# Patient Record
Sex: Female | Born: 1965 | Race: White | Hispanic: No | Smoking: Current every day smoker
Health system: Southern US, Community
[De-identification: ages and names within clinical notes are randomized; demographics above are authoritative.]

## PROBLEM LIST (undated history)

## (undated) DIAGNOSIS — F32A Depression, unspecified: Secondary | ICD-10-CM

## (undated) DIAGNOSIS — F329 Major depressive disorder, single episode, unspecified: Secondary | ICD-10-CM

## (undated) DIAGNOSIS — F419 Anxiety disorder, unspecified: Secondary | ICD-10-CM

## (undated) DIAGNOSIS — N200 Calculus of kidney: Secondary | ICD-10-CM

## (undated) HISTORY — DX: Major depressive disorder, single episode, unspecified: F32.9

## (undated) HISTORY — PX: PLACEMENT OF BREAST IMPLANTS: SHX6334

## (undated) HISTORY — DX: Anxiety disorder, unspecified: F41.9

## (undated) HISTORY — PX: NASAL SINUS SURGERY: SHX719

## (undated) HISTORY — DX: Depression, unspecified: F32.A

---

## 2017-06-08 ENCOUNTER — Emergency Department
Admission: EM | Admit: 2017-06-08 | Discharge: 2017-06-08 | Disposition: A | Discharge status: Home / Self Care | Payer: Managed Care, Other (non HMO) | Attending: Emergency Medicine | Admitting: Emergency Medicine

## 2017-06-08 ENCOUNTER — Emergency Department: Payer: Managed Care, Other (non HMO)

## 2017-06-08 ENCOUNTER — Encounter: Payer: Self-pay | Admitting: Emergency Medicine

## 2017-06-08 ENCOUNTER — Other Ambulatory Visit: Payer: Self-pay

## 2017-06-08 DIAGNOSIS — A09 Infectious gastroenteritis and colitis, unspecified: Secondary | ICD-10-CM

## 2017-06-08 DIAGNOSIS — R109 Unspecified abdominal pain: Secondary | ICD-10-CM | POA: Insufficient documentation

## 2017-06-08 DIAGNOSIS — F17210 Nicotine dependence, cigarettes, uncomplicated: Secondary | ICD-10-CM | POA: Insufficient documentation

## 2017-06-08 HISTORY — DX: Calculus of kidney: N20.0

## 2017-06-08 LAB — CBC WITH DIFFERENTIAL/PLATELET
Basophils Absolute: 0 10*3/uL (ref 0–0.1)
Basophils Relative: 0 %
Eosinophils Absolute: 0.1 10*3/uL (ref 0–0.7)
Eosinophils Relative: 1 %
HEMATOCRIT: 44.9 % (ref 35.0–47.0)
Hemoglobin: 15.1 g/dL (ref 12.0–16.0)
LYMPHS PCT: 27 %
Lymphs Abs: 2 10*3/uL (ref 1.0–3.6)
MCH: 31.5 pg (ref 26.0–34.0)
MCHC: 33.7 g/dL (ref 32.0–36.0)
MCV: 93.6 fL (ref 80.0–100.0)
MONO ABS: 0.5 10*3/uL (ref 0.2–0.9)
MONOS PCT: 7 %
NEUTROS ABS: 5 10*3/uL (ref 1.4–6.5)
Neutrophils Relative %: 65 %
Platelets: 283 10*3/uL (ref 150–440)
RBC: 4.8 MIL/uL (ref 3.80–5.20)
RDW: 12.9 % (ref 11.5–14.5)
WBC: 7.6 10*3/uL (ref 3.6–11.0)

## 2017-06-08 LAB — COMPREHENSIVE METABOLIC PANEL
ALT: 12 U/L — ABNORMAL LOW (ref 14–54)
ANION GAP: 9 (ref 5–15)
AST: 25 U/L (ref 15–41)
Albumin: 4.6 g/dL (ref 3.5–5.0)
Alkaline Phosphatase: 56 U/L (ref 38–126)
BILIRUBIN TOTAL: 0.6 mg/dL (ref 0.3–1.2)
BUN: 11 mg/dL (ref 6–20)
CO2: 24 mmol/L (ref 22–32)
Calcium: 9.3 mg/dL (ref 8.9–10.3)
Chloride: 104 mmol/L (ref 101–111)
Creatinine, Ser: 0.6 mg/dL (ref 0.44–1.00)
GFR calc Af Amer: >60 mL/min (ref >60)
Glucose, Bld: 112 mg/dL — ABNORMAL HIGH (ref 65–99)
POTASSIUM: 3.1 mmol/L — AB (ref 3.5–5.1)
Sodium: 137 mmol/L (ref 135–145)
TOTAL PROTEIN: 6.9 g/dL (ref 6.5–8.1)

## 2017-06-08 LAB — URINALYSIS, COMPLETE (UACMP) WITH MICROSCOPIC
Bilirubin Urine: NEGATIVE
GLUCOSE, UA: NEGATIVE mg/dL
Hgb urine dipstick: NEGATIVE
Ketones, ur: 5 mg/dL — AB
Leukocytes, UA: NEGATIVE
Nitrite: NEGATIVE
PROTEIN: NEGATIVE mg/dL
Specific Gravity, Urine: 1.012 (ref 1.005–1.030)
pH: 8 (ref 5.0–8.0)

## 2017-06-08 LAB — POCT PREGNANCY, URINE: PREG TEST UR: NEGATIVE

## 2017-06-08 LAB — TROPONIN I: Troponin I: <0.03 ng/mL (ref <0.03)

## 2017-06-08 LAB — LIPASE, BLOOD: LIPASE: 33 U/L (ref 11–51)

## 2017-06-08 MED ORDER — HYDROMORPHONE HCL 1 MG/ML IJ SOLN
1.0000 mg | Freq: Once | INTRAMUSCULAR | Status: AC
  Administered 2017-06-08: 1 mg via INTRAVENOUS
  Filled 2017-06-08: qty 1

## 2017-06-08 MED ORDER — DICYCLOMINE HCL 10 MG/ML IM SOLN
20.0000 mg | Freq: Once | INTRAMUSCULAR | Status: AC
  Administered 2017-06-08: 20 mg via INTRAMUSCULAR
  Filled 2017-06-08 (×2): qty 2

## 2017-06-08 MED ORDER — DICYCLOMINE HCL 20 MG PO TABS: 20.0000 mg | ORAL_TABLET | Freq: Three times a day (TID) | ORAL | 0 refills | Status: AC | PRN

## 2017-06-08 MED ORDER — SODIUM CHLORIDE 0.9 % IV BOLUS (SEPSIS)
1000.0000 mL | Freq: Once | INTRAVENOUS | Status: AC
  Administered 2017-06-08: 1000 mL via INTRAVENOUS

## 2017-06-08 MED ORDER — SUCRALFATE 1 G PO TABS: 1.0000 g | ORAL_TABLET | Freq: Four times a day (QID) | ORAL | 0 refills | Status: AC

## 2017-06-08 NOTE — ED Triage Notes (Signed)
Sudden onset on generalized abd pain. Given fentanyl without relief.

## 2017-06-08 NOTE — ED Provider Notes (Signed)
Patient was evaluated by Dr. Earlene Plateravis with surgery.  At this point he does not think that patient's clinical exam or picture is consistent with small bowel obstruction.  He does think more likely gastroenteritis given patient's history that he was told him of vomiting and some diarrhea.  On my exam patient had some recurrence of the pain.  She was given Bentyl.  She did states she felt better with Bentyl and was able to comfortably sit at the edge of the bed.  Discussed with patient that we could certainly send her home with Bentyl.  She felt comfortable with the plan.  Will give prescription for Bentyl as well as sucralfate for likely gastroenteritis.  We did discuss return precautions.   Phineas SemenGoodman, Andrea Scotto, MD 06/08/17 [id number]

## 2017-06-08 NOTE — ED Notes (Signed)
Pt sitting up on end of bed. Pt states she "thinks" the bentyl is helping pain. Father at bedside. Pt appears in no acute distress. resps unlabored, skin normal color warm and dry.

## 2017-06-08 NOTE — ED Notes (Signed)
Pt medicated with bentyl and give a warm pack to hold on her stomach

## 2017-06-08 NOTE — Consult Note (Addendum)
SURGICAL CONSULTATION NOTE (initial) - cpt: 16109: [id number]  HISTORY OF PRESENT ILLNESS (HPI):  52 y.o. female presented to [facility] ED for evaluation of abdominal pain. Patient reports she experienced diarrhea with nausea yesterday and was feeling okay today with +flatus ("normal amount") and only mild nausea, though no BM yet today, until acute onset of severe epigastric abdominal pain without emesis, abdominal distention, or prior episodes of similar. While in the ED, she received a single dose of Dilaudid 2 hours ago and reports her pain has nearly, though not entirely, resolved. She adds that this pain feels different than her "usual" back pain associated with former passage of kidney stones, most recently 1 year ago. She otherwise denies fever/chills, CP, or SOB.  Surgery is consulted by ED physician Dr. Alphonzo LemmingsMcShane in this context for evaluation and management of gastroenteritis vs partial SBO.  PAST MEDICAL HISTORY (PMH):  Past Medical History:  Diagnosis Date  . Kidney stones      PAST SURGICAL HISTORY [facility]):  Past Surgical History:  Procedure Laterality Date  . CESAREAN SECTION    . NASAL SINUS SURGERY    . PLACEMENT OF BREAST IMPLANTS       MEDICATIONS:  Prior to Admission medications   Not on File     ALLERGIES:  Allergies  Allergen Reactions  . Sulfa Antibiotics Hives     SOCIAL HISTORY:  Social History   Socioeconomic History  . Marital status: Single    Spouse name: Not on file  . Number of children: Not on file  . Years of education: Not on file  . Highest education level: Not on file  Social Needs  . Financial resource strain: Not on file  . Food insecurity - worry: Not on file  . Food insecurity - inability: Not on file  . Transportation needs - medical: Not on file  . Transportation needs - non-medical: Not on file  Occupational History  . Not on file  Tobacco Use  . Smoking status: Current Every Day Smoker    Packs/day: 0.50  . Smokeless tobacco: Never Used   Substance and Sexual Activity  . Alcohol use: Yes    Alcohol/week: 0.6 oz    Types: 1 Cans of beer per week  . Drug use: No  . Sexual activity: Not on file  Other Topics Concern  . Not on file  Social History Narrative  . Not on file    The patient currently resides (home / rehab facility / nursing home): Home The patient normally is (ambulatory / bedbound): Ambulatory   FAMILY HISTORY:  History reviewed. No pertinent family history.   REVIEW OF SYSTEMS:  Constitutional: denies weight loss, fever, chills, or sweats  Eyes: denies any other vision changes, history of eye injury  ENT: denies sore throat, hearing problems  Respiratory: denies shortness of breath, wheezing  Cardiovascular: denies chest pain, palpitations  Gastrointestinal: abdominal pain, N/V, and bowel function as per HPI Genitourinary: denies burning with urination or urinary frequency Musculoskeletal: denies any other joint pains or cramps  Skin: denies any other rashes or skin discolorations  Neurological: denies any other headache, dizziness, weakness  Psychiatric: denies any other depression, anxiety   All other review of systems were negative   VITAL SIGNS:  Temp:  [98.7 F (37.1 C)] 98.7 F (37.1 C) (01/27 1826) Pulse Rate:  [65-86] 65 (01/27 1946) Resp:  [16-25] 16 (01/27 2000) BP: (124)/(75-88) 124/88 (01/27 1900) SpO2:  [100 %] 100 % (01/27 1946) Weight:  [115 lb (52.2  kg)] 115 lb (52.2 kg) (01/27 1828)     Height: 5' 6.5" (168.9 cm) Weight: 115 lb (52.2 kg) BMI (Calculated): 18.29   INTAKE/OUTPUT:  This shift: No intake/output data recorded.  Last 2 shifts: @IOLAST2SHIFTS @   PHYSICAL EXAM:  Constitutional:  -- Normal body habitus  -- Awake, alert, and oriented x3, no apparent distress Eyes:  -- Pupils equally round and reactive to light  -- No scleral icterus, B/L no occular discharge Ear, nose, throat: -- Neck is FROM WNL -- No jugular venous distension  Pulmonary:  -- No wheezes or  rhales -- Equal breath sounds bilaterally -- Breathing non-labored at rest Cardiovascular:  -- S1, S2 present  -- No pericardial rubs  Gastrointestinal:  -- Abdomen soft and completely non-distended with minimal epigastric abdominal tenderness to palpation, no guarding or rebound tenderness -- No abdominal masses appreciated, pulsatile or otherwise  Musculoskeletal and Integumentary:  -- Wounds or skin discoloration: None appreciated -- Extremities: B/L UE and LE FROM, hands and feet warm, no edema  Neurologic:  -- Motor function: Intact and symmetric -- Sensation: Intact and symmetric Psychiatric:  -- Mood and affect WNL  Labs:  CBC Latest Ref Rng & Units 06/08/2017  WBC 3.6 - 11.0 K/uL 7.6  Hemoglobin 12.0 - 16.0 g/dL 16.1  Hematocrit 09.6 - 47.0 % 44.9  Platelets 150 - 440 K/uL 283   CMP Latest Ref Rng & Units 06/08/2017  Glucose 65 - 99 mg/dL 045(W)  BUN 6 - 20 mg/dL 11  Creatinine 0.98 - 1.19 mg/dL 1.47  Sodium 829 - 562 mmol/L 137  Potassium 3.5 - 5.1 mmol/L 3.1(L)  Chloride 101 - 111 mmol/L 104  CO2 22 - 32 mmol/L 24  Calcium 8.9 - 10.3 mg/dL 9.3  Total Protein 6.5 - 8.1 g/dL 6.9  Total Bilirubin 0.3 - 1.2 mg/dL 0.6  Alkaline Phos 38 - 126 U/L 56  AST 15 - 41 U/L 25  ALT 14 - 54 U/L 12(L)    Imaging studies:  CT Abdomen and Pelvis without Contrast (06/08/2017) - personally reviewed with ED physician and discussed with patient and her family Stomach/Bowel: Mild small bowel dilatation within the central abdomen and right lower quadrant, fluid-filled with associated air-fluid levels, with probable transition zone in the right abdomen and/or right lower quadrant, with relatively decompression of the distal small loops within the lower abdomen and pelvis, suggesting partial small bowel obstruction. At least some degree of bowel wall thickening at the suspected transition zone in the right lower quadrant, but no obstructing mass identified.  Appendix is not  convincingly seen but there are no focal inflammatory changes about the cecum to suggest acute appendicitis. No free fluid or abscess collection identified. No free intraperitoneal air.  Multiple right renal stones, measuring 1-6 mm in size. Multiple left renal  stones, largest also measuring 6 mm in size. No hydronephrosis bilaterally.  No perinephric fluid. No ureteral or bladder calculi identified.  Assessment/Plan: (ICD-10's: A09, less likely K2.600) 52 y.o. female with nausea, diarrhea, and crampy upper abdominal pain with flatus and without abdominal distention or emesis, more likely attributable to gastroenteritis/ileus with surgical history only significant for c-section, though cannot entirely exclude less likely possibility of partial small bowel obstruction or inflammatory bowel disease, complicated by pertinent comorbidities including ongoing smoking tobacco abuse and nephrolithiasis.   - IV fluids/hydration  - no indication for surgical intervention at this time  - if admitted for pain/dehydration, will follow, discussed possibility of NG tube if worsens  -  otherwise, if symptoms continue to resolve without further medication/intervention, may follow-up with PMD  - monitor abdominal exam and bowel function, please call if any questions/concerns  All of the above findings and recommendations were discussed with the patient and her family, and all of patient's and her family's questions were answered to her expressed satisfaction.  Thank you for the opportunity to participate in this patient's care.   -- Scherrie Gerlach Earlene Plater, MD, RPVI [place]: [facility] General Surgery - Partnering for exceptional care. Office: [phone]

## 2017-06-08 NOTE — Discharge Instructions (Signed)
Please seek medical attention for any high fevers, chest pain, shortness of breath, change in behavior, persistent vomiting, bloody stool or any other new or concerning symptoms.  

## 2017-06-08 NOTE — ED Notes (Signed)
Report from susan, rn.  

## 2017-06-08 NOTE — ED Notes (Signed)
Lab rejected urine - registration changed the name from meg to Andrea Gonzales (her true given name) but all other info the same (bd and mrn). Dr made aware.

## 2017-06-08 NOTE — ED Provider Notes (Signed)
[facility] Emergency Department Provider Note  ____________________________________________   I have reviewed the triage vital signs and the nursing notes. Where available I have reviewed prior notes and, if possible and indicated, outside hospital notes.    HISTORY  Chief Complaint Abdominal Pain    HPI Emberly Tomasso Dymek is a 52 y.o. female   who is never had abdominal surgery, does have a history of kidney stones, is not socially active denies pregnancy, presents today complaining of rapid onset upper abdominal pain, is very tender, she has nausea but no vomiting.  She is have not any fever or diarrhea, states she has had normal bowel movements "recently".  Denies any abdominal trauma has not had pain like this before.  Nothing makes it better nothing makes it worse, it is persistent sharp epigastric right upper quadrant left upper quadrant, no radiation no other associated symptoms no fever has not had anything like this before.  Patient was in a great deal of distress according to EMS in terms of her pain level, received fentanyl on the way in.     Past Medical History:  Diagnosis Date  . Kidney stones     There are no active problems to display for this patient.   Past Surgical History:  Procedure Laterality Date  . CESAREAN SECTION    . NASAL SINUS SURGERY    . PLACEMENT OF BREAST IMPLANTS      Prior to Admission medications   Not on File    Allergies Sulfa antibiotics  History reviewed. No pertinent family history.  Social History Social History   Tobacco Use  . Smoking status: Current Every Day Smoker    Packs/day: 0.50  . Smokeless tobacco: Never Used  Substance Use Topics  . Alcohol use: Yes    Alcohol/week: 0.6 oz    Types: 1 Cans of beer per week  . Drug use: No    Review of Systems Constitutional: No fever/chills Eyes: No visual changes. ENT: No sore throat. No stiff neck no neck pain Cardiovascular: Denies  chest pain. Respiratory: Denies shortness of breath. Gastrointestinal:   no vomiting.  No diarrhea.  No constipation. Genitourinary: Negative for dysuria. Musculoskeletal: Negative lower extremity swelling Skin: Negative for rash. Neurological: Negative for severe headaches, focal weakness or numbness.   ____________________________________________   PHYSICAL EXAM:  VITAL SIGNS: ED Triage Vitals  Enc Vitals Group     BP 06/08/17 1826 124/75     Pulse Rate 06/08/17 1826 86     Resp 06/08/17 1826 (!) 22     Temp 06/08/17 1826 98.7 F (37.1 C)     Temp src --      SpO2 06/08/17 1826 100 %     Weight 06/08/17 1828 115 lb (52.2 kg)     Height 06/08/17 1828 5' 6.5" (1.689 m)     Head Circumference --      Peak Flow --      Pain Score 06/08/17 1828 10     Pain Loc --      Pain Edu? --      Excl. in GC? --     Constitutional: Alert and oriented. Well appearing, she does not appear to be toxic but she is very anxious and upset Eyes: Conjunctivae are normal Head: Atraumatic HEENT: No congestion/rhinnorhea. Mucous membranes are moist.  Oropharynx non-erythematous Neck:   Nontender with no meningismus, no masses, no stridor Cardiovascular: Normal rate, regular rhythm. Grossly normal heart sounds.  Good peripheral circulation. Respiratory:  Normal respiratory effort.  No retractions. Lungs CTAB. Abdominal: Soft and significant diffuse tenderness noted in the upper abdomen, voluntary guarding no rebound soft,No distention Back:  There is no focal tenderness or step off.  there is no midline tenderness there are no lesions noted. there is no CVA tenderness Musculoskeletal: No lower extremity tenderness, no upper extremity tenderness. No joint effusions, no DVT signs strong distal pulses no edema Neurologic:  Normal speech and language. No gross focal neurologic deficits are appreciated.  Skin:  Skin is warm, dry and intact. No rash noted. Psychiatric: Mood and affect are normal. Speech  and behavior are normal.  ____________________________________________   LABS (all labs ordered are listed, but only abnormal results are displayed)  Labs Reviewed  COMPREHENSIVE METABOLIC PANEL - Abnormal; Notable for the following components:      Result Value   Potassium 3.1 (*)    Glucose, Bld 112 (*)    ALT 12 (*)    All other components within normal limits  CBC WITH DIFFERENTIAL/PLATELET  LIPASE, BLOOD  TROPONIN I  PROTIME-INR  POC URINE PREG, ED  POCT PREGNANCY, URINE    Pertinent labs  results that were available during my care of the patient were reviewed by me and considered in my medical decision making (see chart for details). ____________________________________________  EKG  I personally interpreted any EKGs ordered by me or triage Normal sinus rhythm, rate 75 bpm no acute ST elevation or depression, normal axis no acute ischemic changes ____________________________________________  RADIOLOGY  Pertinent labs & imaging results that were available during my care of the patient were reviewed by me and considered in my medical decision making (see chart for details). If possible, patient and/or family made aware of any abnormal findings.  Ct Renal Stone Study  Result Date: 06/08/2017 CLINICAL DATA:  Sudden onset of generalized abd pain with nausea, hx of k-stones and c-section EXAM: CT ABDOMEN AND PELVIS WITHOUT CONTRAST TECHNIQUE: Multidetector CT imaging of the abdomen and pelvis was performed following the standard protocol without IV contrast. COMPARISON:  None. FINDINGS: Lower chest: No acute abnormality. Hepatobiliary: No acute or suspicious liver abnormality is seen. No gallstones, gallbladder wall thickening, or biliary dilatation. Pancreas: Unremarkable. No pancreatic ductal dilatation or surrounding inflammatory changes. Spleen: Normal in size without focal abnormality. Adrenals/Urinary Tract: Multiple right renal stones, measuring 1-6 mm in size. Multiple  left renal stones, largest also measuring 6 mm in size. No hydronephrosis bilaterally. No perinephric fluid. No ureteral or bladder calculi identified. Stomach/Bowel: Mild small bowel dilatation within the central abdomen and right lower quadrant, fluid-filled with associated air-fluid levels, with probable transition zone in the right abdomen and/or right lower quadrant, with relatively decompression of the distal small loops within the lower abdomen and pelvis, suggesting partial small bowel obstruction. At least some degree of bowel wall thickening at the suspected transition zone in the right lower quadrant, but no obstructing mass identified. Appendix is not convincingly seen but there are no focal inflammatory changes about the cecum to suggest acute appendicitis. Vascular/Lymphatic: No significant vascular findings are present. No enlarged abdominal or pelvic lymph nodes. Reproductive: Intrauterine device appears appropriately positioned. No adnexal mass or free fluid. Other: No free fluid or abscess collection identified. No free intraperitoneal air. Musculoskeletal: Mild degenerative disc desiccations throughout the lumbar spine. No acute or suspicious osseous finding. IMPRESSION: 1. Mildly distended fluid-filled small bowel loops throughout the central abdomen and right lower quadrant, most suggestive of partial small bowel obstruction. Probable transition zone in the  right abdomen and/or right lower quadrant, with at least some degree of bowel wall thickening at the suspected transition zone in the right lower quadrant. This could be reactive bowel wall thickening or associated focal enteritis of infectious, inflammatory or ischemic nature. No obstructing mass identified. No pneumatosis intestinalis or portal venous gas seen. 2. Associated mild fluid stranding within the right abdominal mesentery. No abscess collection or free intraperitoneal air. 3. Bilateral nephrolithiasis. No hydronephrosis. No  ureteral or bladder calculi identified. 4. Additional chronic/incidental findings detailed above. Electronically Signed   By: Bary Richard M.D.   On: 06/08/2017 19:47   ____________________________________________    PROCEDURES  Procedure(s) performed: None  Procedures  Critical Care performed: None  ____________________________________________   INITIAL IMPRESSION / ASSESSMENT AND PLAN / ED COURSE  Pertinent labs & imaging results that were available during my care of the patient were reviewed by me and considered in my medical decision making (see chart for details).  Here with significant abdominal pain which is not sudden onset but quite rapid, abdomen is tender but not acutely surgical there is no peritoneal signs.  However we did do a CT scan, patient's pain is better controlled after pain medication here and we will keep an eye on her.  CT scan shows a possible SBO with transition point in the right side of the abdomen.  Blood work is reassuring, I have discussed with Dr. Earlene Plater of surgery who will come evaluate the patient, signing out to Dr. Derrill Kay at the end of my shift.    ____________________________________________   FINAL CLINICAL IMPRESSION(S) / ED DIAGNOSES  Final diagnoses:  None      This chart was dictated using voice recognition software.  Despite best efforts to proofread,  errors can occur which can change meaning.      Jeanmarie Plant, MD 06/08/17 2033

## 2017-06-25 DIAGNOSIS — A09 Infectious gastroenteritis and colitis, unspecified: Secondary | ICD-10-CM | POA: Insufficient documentation

## 2017-12-02 ENCOUNTER — Encounter: Payer: Self-pay | Admitting: Emergency Medicine

## 2017-12-02 ENCOUNTER — Emergency Department
Admission: EM | Admit: 2017-12-02 | Discharge: 2017-12-02 | Disposition: A | Discharge status: Home / Self Care | Payer: Managed Care, Other (non HMO) | Attending: Emergency Medicine | Admitting: Emergency Medicine

## 2017-12-02 ENCOUNTER — Other Ambulatory Visit: Payer: Self-pay

## 2017-12-02 DIAGNOSIS — F17210 Nicotine dependence, cigarettes, uncomplicated: Secondary | ICD-10-CM | POA: Diagnosis not present

## 2017-12-02 DIAGNOSIS — R1031 Right lower quadrant pain: Secondary | ICD-10-CM | POA: Diagnosis not present

## 2017-12-02 DIAGNOSIS — R1030 Lower abdominal pain, unspecified: Secondary | ICD-10-CM | POA: Diagnosis present

## 2017-12-02 LAB — COMPREHENSIVE METABOLIC PANEL
ALBUMIN: 4.2 g/dL (ref 3.5–5.0)
ALK PHOS: 53 U/L (ref 38–126)
ALT: 12 U/L (ref 0–44)
ANION GAP: 6 (ref 5–15)
AST: 20 U/L (ref 15–41)
BILIRUBIN TOTAL: 0.9 mg/dL (ref 0.3–1.2)
BUN: 17 mg/dL (ref 6–20)
CO2: 27 mmol/L (ref 22–32)
Calcium: 9.1 mg/dL (ref 8.9–10.3)
Chloride: 107 mmol/L (ref 98–111)
Creatinine, Ser: 0.61 mg/dL (ref 0.44–1.00)
GFR calc Af Amer: >60 mL/min (ref >60)
GFR calc non Af Amer: >60 mL/min (ref >60)
GLUCOSE: 94 mg/dL (ref 70–99)
POTASSIUM: 3.8 mmol/L (ref 3.5–5.1)
Sodium: 140 mmol/L (ref 135–145)
Total Protein: 6.9 g/dL (ref 6.5–8.1)

## 2017-12-02 LAB — URINALYSIS, COMPLETE (UACMP) WITH MICROSCOPIC
BACTERIA UA: NONE SEEN
Bilirubin Urine: NEGATIVE
GLUCOSE, UA: NEGATIVE mg/dL
Ketones, ur: 5 mg/dL — AB
Leukocytes, UA: NEGATIVE
Nitrite: NEGATIVE
PROTEIN: NEGATIVE mg/dL
RBC / HPF: >50 RBC/hpf — ABNORMAL HIGH (ref 0–5)
Specific Gravity, Urine: 1.013 (ref 1.005–1.030)
pH: 7 (ref 5.0–8.0)

## 2017-12-02 LAB — POCT PREGNANCY, URINE: Preg Test, Ur: NEGATIVE

## 2017-12-02 LAB — CBC
HEMATOCRIT: 43.2 % (ref 35.0–47.0)
HEMOGLOBIN: 15.1 g/dL (ref 12.0–16.0)
MCH: 33 pg (ref 26.0–34.0)
MCHC: 35 g/dL (ref 32.0–36.0)
MCV: 94.3 fL (ref 80.0–100.0)
Platelets: 262 10*3/uL (ref 150–440)
RBC: 4.58 MIL/uL (ref 3.80–5.20)
RDW: 13.5 % (ref 11.5–14.5)
WBC: 5.8 10*3/uL (ref 3.6–11.0)

## 2017-12-02 LAB — LIPASE, BLOOD: Lipase: 38 U/L (ref 11–51)

## 2017-12-02 NOTE — ED Notes (Signed)
Pt c/o RLQ pain that started out like menstrual cramps around 5am this morning with nausea and began having intermittent aching pain . States she has hx of kidney stones but always had back pain with it never in the front.

## 2017-12-02 NOTE — Discharge Instructions (Addendum)
Return to the ER right away if you have worsening symptoms including worse abdominal pain, vomiting, fever, or lightheadedness.

## 2017-12-02 NOTE — ED Triage Notes (Addendum)
Sent from [facility] for CT scan to r/o appendicitis.  Arrives c/o RLQ abdominal pain since 0530.  Pain has been intermittent.  Symptoms include nausea.  States pain radiates toward back.

## 2017-12-02 NOTE — ED Provider Notes (Signed)
[facility] Emergency Department Provider Note  ____________________________________________  Time seen: Approximately 5:05 PM  I have reviewed the triage vital signs and the nursing notes.   HISTORY  Chief Complaint Abdominal Pain    HPI Andrea Gonzales is a 52 y.o. female with a history of kidney stones who complains of diffuse lower abdominal cramping pain that started about 5 AM today.  Pain was constant, gradually onset and gradually worsening, radiating to the back.  No associated nausea vomiting fevers chills or sweats.  No aggravating or alleviating factors.  Through today, the pain continued worsening until it was severe causing her to go home from work and try to rest.  The pain continued so she went to [facility] where she was then sent to the ED for evaluation of possible appendicitis.  She does note that is very tender to the touch in the right lower quadrant.  This feels different from her previous kidney stone pain.  Since coming to the ED, pain feels better and is intermittent.  Still radiates to the back.  No vomiting.  Normal oral intake.  No diarrhea or constipation.    No abnormal vaginal bleeding or discharge.   Past Medical History:  Diagnosis Date  . Kidney stones      Patient Active Problem List   Diagnosis Date Noted  . Infectious colitis, enteritis, and gastroenteritis      Past Surgical History:  Procedure Laterality Date  . CESAREAN SECTION    . NASAL SINUS SURGERY    . PLACEMENT OF BREAST IMPLANTS       Prior to Admission medications   Medication Sig Start Date End Date Taking? Authorizing Provider  dicyclomine (BENTYL) 20 MG tablet Take 1 tablet (20 mg total) by mouth 3 (three) times daily as needed (abdominal pain). 06/08/17   Phineas Semen, MD  sucralfate (CARAFATE) 1 g tablet Take 1 tablet (1 g total) by mouth 4 (four) times daily. 06/08/17   Phineas Semen, MD     Allergies Sulfa  antibiotics   No family history on file.  Social History Social History   Tobacco Use  . Smoking status: Current Every Day Smoker    Packs/day: 0.50  . Smokeless tobacco: Never Used  Substance Use Topics  . Alcohol use: Yes    Alcohol/week: 0.6 oz    Types: 1 Cans of beer per week  . Drug use: No    Review of Systems  Constitutional:   No fever or chills.  ENT:   No sore throat. No rhinorrhea. Cardiovascular:   No chest pain or syncope. Respiratory:   No dyspnea or cough. Gastrointestinal: Positive as above for abdominal pain without vomiting and diarrhea.  Musculoskeletal:   Negative for focal pain or swelling All other systems reviewed and are negative except as documented above in ROS and HPI.  ____________________________________________   PHYSICAL EXAM:  VITAL SIGNS: ED Triage Vitals  Enc Vitals Group     BP 12/02/17 1407 94/62     Pulse Rate 12/02/17 1407 72     Resp 12/02/17 1407 16     Temp 12/02/17 1407 98.4 F (36.9 C)     Temp Source 12/02/17 1407 Oral     SpO2 12/02/17 1407 100 %     Weight 12/02/17 1403 111 lb (50.3 kg)     Height 12/02/17 1403 5' 6.5" (1.689 m)     Head Circumference --      Peak Flow --  Pain Score 12/02/17 1403 6     Pain Loc --      Pain Edu? --      Excl. in GC? --     Vital signs reviewed, nursing assessments reviewed.   Constitutional:   Alert and oriented. Non-toxic appearance.  Energetic and ambulatory. Eyes:   Conjunctivae are normal. EOMI. PERRL. ENT      Head:   Normocephalic and atraumatic.      Nose:   No congestion/rhinnorhea.       Mouth/Throat:   MMM, no pharyngeal erythema. No peritonsillar mass.       Neck:   No meningismus. Full ROM. Hematological/Lymphatic/Immunilogical:   No cervical lymphadenopathy. Cardiovascular:   RRR. Symmetric bilateral radial and DP pulses.  No murmurs. Cap refill less than 2 seconds. Respiratory:   Normal respiratory effort without tachypnea/retractions. Breath sounds are  clear and equal bilaterally. No wheezes/rales/rhonchi. Gastrointestinal:   Soft with focal right lower quadrant tenderness at McBurney's point.. Non distended. There is no CVA tenderness.  No rebound, rigidity, or guarding. Musculoskeletal:   Normal range of motion in all extremities. No joint effusions.  No lower extremity tenderness.  No edema. Neurologic:   Normal speech and language.  Motor grossly intact. No acute focal neurologic deficits are appreciated.   ____________________________________________    LABS (pertinent positives/negatives) (all labs ordered are listed, but only abnormal results are displayed) Labs Reviewed  URINALYSIS, COMPLETE (UACMP) WITH MICROSCOPIC - Abnormal; Notable for the following components:      Result Value   Color, Urine YELLOW (*)    APPearance CLEAR (*)    Hgb urine dipstick MODERATE (*)    Ketones, ur 5 (*)    RBC / HPF >50 (*)    All other components within normal limits  LIPASE, BLOOD  COMPREHENSIVE METABOLIC PANEL  CBC  POC URINE PREG, ED  POCT PREGNANCY, URINE   ____________________________________________   EKG    ____________________________________________    RADIOLOGY  No results found.  ____________________________________________   PROCEDURES Procedures  ____________________________________________  DIFFERENTIAL DIAGNOSIS   Appendicitis, ovarian cyst, torsion, bacterial vaginosis, renal colic, intestinal gas  CLINICAL IMPRESSION / ASSESSMENT AND PLAN / ED COURSE  Pertinent labs & imaging results that were available during my care of the patient were reviewed by me and considered in my medical decision making (see chart for details).    Patient presents with lower abdominal pain that seems to have migrated to the right lower quadrant.  She has focal tenderness.  Vital signs are normal, labs are normal including white blood cell count.  Discussed the patient the concern of appendicitis versus ovarian cyst.  She  reports feeling reassured by the lack of fever and normal white blood cell count.  She reports that due to financial concerns and not being able to afford having a CT scan done, she does not want to have it unless is absolutely necessary because she definitely has appendicitis.  Counseled her that it is unable to be sure without testing, but that I am worried about appendicitis without focal tenderness at McBurney's point and that my usual practice would be to get a CT scan.  She understands, but declines the CT scan and wishes to follow-up with her doctor tomorrow.  She does agree that she will return immediately if she has any worsening symptoms including elevating pain, generalized pain or rigidity, fever chills sweats vomiting or lightheadedness.  She has medical decision-making capacity.  At this point given the somewhat nonspecific  nature of her presentation, it is not unreasonable for her to opt for outpatient follow-up at this point, so she will be discharged after extensive conversation and careful return precautions.   She is perimenopausal without history of ovarian cyst. Torsion is unlikely . Doubt pelvic cancer.       ____________________________________________   FINAL CLINICAL IMPRESSION(S) / ED DIAGNOSES    Final diagnoses:  Right lower quadrant abdominal pain     ED Discharge Orders    None      Portions of this note were generated with dragon dictation software. Dictation errors may occur despite best attempts at proofreading.    Sharman CheekStafford, Teairra Millar, MD 12/02/17 1714

## 2017-12-03 ENCOUNTER — Emergency Department
Admission: EM | Admit: 2017-12-03 | Discharge: 2017-12-03 | Disposition: A | Discharge status: Home / Self Care | Payer: Managed Care, Other (non HMO) | Attending: Emergency Medicine | Admitting: Emergency Medicine

## 2017-12-03 ENCOUNTER — Encounter: Payer: Self-pay | Admitting: Urology

## 2017-12-03 ENCOUNTER — Other Ambulatory Visit: Payer: Self-pay | Admitting: Radiology

## 2017-12-03 ENCOUNTER — Ambulatory Visit: Payer: Managed Care, Other (non HMO) | Admitting: Urology

## 2017-12-03 ENCOUNTER — Emergency Department: Payer: Managed Care, Other (non HMO)

## 2017-12-03 ENCOUNTER — Encounter: Payer: Self-pay | Admitting: Emergency Medicine

## 2017-12-03 VITALS — BP 92/60 | HR 75 | Ht 66.0 in | Wt 112.0 lb

## 2017-12-03 DIAGNOSIS — Z79899 Other long term (current) drug therapy: Secondary | ICD-10-CM | POA: Diagnosis not present

## 2017-12-03 DIAGNOSIS — N200 Calculus of kidney: Secondary | ICD-10-CM

## 2017-12-03 DIAGNOSIS — F17200 Nicotine dependence, unspecified, uncomplicated: Secondary | ICD-10-CM | POA: Diagnosis not present

## 2017-12-03 DIAGNOSIS — N201 Calculus of ureter: Secondary | ICD-10-CM

## 2017-12-03 DIAGNOSIS — R1011 Right upper quadrant pain: Secondary | ICD-10-CM | POA: Diagnosis present

## 2017-12-03 LAB — URINALYSIS, COMPLETE
Bilirubin, UA: NEGATIVE
GLUCOSE, UA: NEGATIVE
KETONES UA: NEGATIVE
Leukocytes, UA: NEGATIVE
NITRITE UA: NEGATIVE
Protein, UA: NEGATIVE
SPEC GRAV UA: 1.02 (ref 1.005–1.030)
UUROB: 0.2 mg/dL (ref 0.2–1.0)
pH, UA: 7.5 (ref 5.0–7.5)

## 2017-12-03 LAB — MICROSCOPIC EXAMINATION
RBC, UA: >30 /hpf — ABNORMAL HIGH (ref 0–2)
WBC, UA: NONE SEEN /hpf (ref 0–5)

## 2017-12-03 MED ORDER — TAMSULOSIN HCL 0.4 MG PO CAPS: 0.4000 mg | ORAL_CAPSULE | Freq: Every day | ORAL | 0 refills | Status: AC

## 2017-12-03 MED ORDER — OXYCODONE-ACETAMINOPHEN 5-325 MG PO TABS: 1.0000 | ORAL_TABLET | ORAL | 0 refills | Status: DC | PRN

## 2017-12-03 NOTE — ED Triage Notes (Signed)
Patient presents to ED via POV from home with c/o right sided abdominal pain that radiates to her right flank. Patient reports being here yesterday for same but leaving prior to getting her ordered CT scan. Patient reports her pain woke her from her sleep this morning at 0400. Patient denies nausea/vomiting.

## 2017-12-03 NOTE — ED Provider Notes (Signed)
[facility] Emergency Department Provider Note  Time seen: 7:45 AM  I have reviewed the triage vital signs and the nursing notes.   HISTORY  Chief Complaint Abdominal Pain    HPI Andrea Gonzales is a 52 y.o. female with a past medical history of kidney stones presents to the emergency department for right flank pain.  According to the patient for the past 2 days she has been experiencing waxing and waning right flank pain.  States that times it will be attended 10 sharp and severe pain located mostly in the right lower to mid abdomen occasionally radiates to the back.  States at other times it will nearly completely go away.  Currently describes the pain as moderate sharp pain.  Patient states she has many kidney stones in the past but believes this feels different than the prior kidney stones.  She came yesterday for evaluation but left prior to CT thinking that this was likely just an on the kidney stone.  However the patient states her pain became severe this morning so she came to the emergency department once again to make sure that it was a kidney stone.  Denies any nausea or vomiting.  No diarrhea.  No chest pain or trouble breathing.   Past Medical History:  Diagnosis Date  . Kidney stones     Patient Active Problem List   Diagnosis Date Noted  . Infectious colitis, enteritis, and gastroenteritis     Past Surgical History:  Procedure Laterality Date  . CESAREAN SECTION    . NASAL SINUS SURGERY    . PLACEMENT OF BREAST IMPLANTS      Prior to Admission medications   Medication Sig Start Date End Date Taking? Authorizing Provider  dicyclomine (BENTYL) 20 MG tablet Take 1 tablet (20 mg total) by mouth 3 (three) times daily as needed (abdominal pain). 06/08/17   Phineas SemenGoodman, Graydon, MD  sucralfate (CARAFATE) 1 g tablet Take 1 tablet (1 g total) by mouth 4 (four) times daily. 06/08/17   Phineas SemenGoodman, Graydon, MD    Allergies  Allergen Reactions  .  Sulfa Antibiotics Hives    No family history on file.  Social History Social History   Tobacco Use  . Smoking status: Current Every Day Smoker    Packs/day: 0.50  . Smokeless tobacco: Never Used  Substance Use Topics  . Alcohol use: Yes    Alcohol/week: 0.6 oz    Types: 1 Cans of beer per week  . Drug use: No    Review of Systems Constitutional: Negative for fever. Cardiovascular: Negative for chest pain. Respiratory: Negative for shortness of breath. Gastrointestinal: Right flank pain, negative for nausea vomiting or diarrhea. Genitourinary: Thought she saw blood in her urine yesterday.  Last menstrual period was the beginning of this month. Musculoskeletal: Negative for musculoskeletal complaints Skin: Negative for skin complaints  Neurological: Negative for headache All other ROS negative  ____________________________________________   PHYSICAL EXAM:  VITAL SIGNS: ED Triage Vitals  Enc Vitals Group     BP --      Pulse Rate 12/03/17 0728 91     Resp 12/03/17 0728 (!) 22     Temp 12/03/17 0728 98.1 F (36.7 C)     Temp Source 12/03/17 0728 Oral     SpO2 12/03/17 0728 100 %     Weight 12/03/17 0729 111 lb 9.6 oz (50.6 kg)     Height 12/03/17 0729 5\' 6"  (1.676 m)     Head Circumference --  Peak Flow --      Pain Score 12/03/17 0729 7     Pain Loc --      Pain Edu? --      Excl. in GC? --    Constitutional: Alert and oriented.  Appears uncomfortable. Eyes: Normal exam ENT   Head: Normocephalic and atraumatic.   Mouth/Throat: Mucous membranes are moist. Cardiovascular: Normal rate, regular rhythm. No murmur Respiratory: Normal respiratory effort without tachypnea nor retractions. Breath sounds are clear  Gastrointestinal: Soft and nontender. No distention.  Musculoskeletal: Nontender with normal range of motion in all extremities.  Neurologic:  Normal speech and language. No gross focal neurologic deficits  Skin:  Skin is warm, dry and intact.   Psychiatric: Mood and affect are normal.   ____________________________________________    RADIOLOGY  CT scan shows 6 x 5 mm right proximal ureteral stone.  ____________________________________________   INITIAL IMPRESSION / ASSESSMENT AND PLAN / ED COURSE  Pertinent labs & imaging results that were available during my care of the patient were reviewed by me and considered in my medical decision making (see chart for details).  Patient was seen in the emergency department yesterday and today for right flank pain.  Differential would include ureterolithiasis, appendicitis, ovarian cyst, colitis or diverticulitis.  I reviewed the patient's lab work from yesterday, reassuringly normal white blood cell count however the patient's urinalysis did show a significant amount of red blood cells.  We will obtain a CT renal scan in the emergency department.  Patient agreeable to plan of care.  CT scan shows 6 x 5 mm right proximal ureteral stone.  This is likely the cause of the patient's discomfort.    I discussed the patient with Dr. Apolinar Junes.  She states that she could do the lithotripsy on Thursday.  We will hold off on Toradol.  She would like to see the patient in her office before she goes to the operating room this morning.  I discussed this with the patient she is agreeable to this plan of care.  We will hold off on pain management at this time as the patient wishes to drive herself to the office.  I will discharge with Percocet and Flomax.  I discussed with the patient to get these filled as soon as she leaves the urology office.  Dr. Apolinar Junes will plan to do a lithotripsy on Thursday.  Patient agreeable with this plan of care. ____________________________________________   FINAL CLINICAL IMPRESSION(S) / ED DIAGNOSES  Right flank pain Kidney stone   Minna Antis, MD 12/03/17 [phone]

## 2017-12-03 NOTE — ED Notes (Signed)
First Nurse Note: Patient complaining of RLQ abd. Pain, seen here yesterday for same.  Placed in WC.  Alert and oriented, color good.

## 2017-12-03 NOTE — Discharge Instructions (Signed)
As we discussed please proceed directly to Dr. Delana MeyerBrandon's office.  Please fill your medications as soon as you leave her office.  Return to the emergency department for any fever, any worsening pain, or any other symptom personally concerning to yourself.

## 2017-12-03 NOTE — Progress Notes (Signed)
12/03/2017 9:47 AM   Andrea Gonzales Nov 26, 1965 [id number]  Referring provider: Dr. Lenard Lance  Chief Complaint  Patient presents with  . Nephrolithiasis    New Patient    HPI: 52 year old female seen earlier today in the emergency room who presents today for an acute add-on visit for evaluation of 6 mm proximal left ureteral stone.    Andrea Gonzales reports developing severe acute onset right flank pain on Monday.  This pain is waxed and waned and is now migrating somewhat to her right lower quadrant.  She presented yesterday to the emergency room but left prematurely prior to having a scan done due to financial concerns.  She continued to have severe poorly controlled pain.  She ultimately returned to the emergency room early this morning with concerns for appendicitis given her right lower quadrant pain along with nausea.  She underwent further evaluation with labs and urinalysis which showed no evidence of infection.  CT stone protocol showed a 6 mm right proximal ureteral stone at the level of L4 with mild hydronephrosis.  She also has bilateral multiple nonobstructing stones measuring up to 6 mm, left greater than right.  Currently, her pain is only moderately well controlled.  She received narcotics in the emergency room but not NSAIDs or Toradol.  She continues to have right flank pain and appears uncomfortable in the office.  She denies any dysuria but has had dark-colored urine.  No fevers or chills.  She does have a personal history of kidney stones.  She moved from [place] about a year ago and is here to establish with a urologist in the community.  She is undergone ureteroscopy x1 but is passed many stones over the years starting at the time of her first pregnancy.  She estimates she passes a stone every approximately 2 years or so.  She is unsure of her stone composition.  She denies ever having a metabolic evaluation.    PMH: Past Medical History:  Diagnosis  Date  . Anxiety   . Depression   . Kidney stones     Surgical History: Past Surgical History:  Procedure Laterality Date  . CESAREAN SECTION    . NASAL SINUS SURGERY    . PLACEMENT OF BREAST IMPLANTS      Home Medications:  Allergies as of 12/03/2017      Reactions   Sulfa Antibiotics Hives      Medication List        Accurate as of 12/03/17 11:59 PM. Always use your most recent med list.          dicyclomine 20 MG tablet Commonly known as:  BENTYL Take 1 tablet (20 mg total) by mouth 3 (three) times daily as needed (abdominal pain).   oxyCODONE-acetaminophen 5-325 MG tablet Commonly known as:  PERCOCET Take 1 tablet by mouth every 4 (four) hours as needed for severe pain.   sucralfate 1 g tablet Commonly known as:  CARAFATE Take 1 tablet (1 g total) by mouth 4 (four) times daily.   tamsulosin 0.4 MG Caps capsule Commonly known as:  FLOMAX Take 1 capsule (0.4 mg total) by mouth daily.       Allergies:  Allergies  Allergen Reactions  . Sulfa Antibiotics Hives    Family History: No family history on file.  Social History:  reports that she has been smoking.  She has been smoking about 0.50 packs per day. She has never used smokeless tobacco. She reports that she drinks about 0.6 oz  of alcohol per week. She reports that she does not use drugs.  ROS: UROLOGY Frequent Urination?: Yes Hard to postpone urination?: No Burning/pain with urination?: No Get up at night to urinate?: No Leakage of urine?: No Urine stream starts and stops?: Yes Trouble starting stream?: No Do you have to strain to urinate?: Yes Blood in urine?: Yes Urinary tract infection?: No Sexually transmitted disease?: No Injury to kidneys or bladder?: No Painful intercourse?: No Weak stream?: Yes Currently pregnant?: No Vaginal bleeding?: No Last menstrual period?: 11/11/17  Gastrointestinal Nausea?: Yes Vomiting?: No Indigestion/heartburn?: No Diarrhea?: Yes Constipation?:  No  Constitutional Fever: No Night sweats?: No Weight loss?: No Fatigue?: No  Skin Skin rash/lesions?: No Itching?: No  Eyes Blurred vision?: No Double vision?: No  Ears/Nose/Throat Sore throat?: No Sinus problems?: No  Hematologic/Lymphatic Swollen glands?: No Easy bruising?: No  Cardiovascular Leg swelling?: No Chest pain?: No  Respiratory Cough?: No Shortness of breath?: No  Endocrine Excessive thirst?: No  Musculoskeletal Back pain?: Yes Joint pain?: No  Neurological Headaches?: No Dizziness?: No  Psychologic Depression?: No Anxiety?: No  Physical Exam: BP 92/60   Pulse 75   Ht 5\' 6"  (1.676 m)   Wt 112 lb (50.8 kg)   LMP 11/10/2017   BMI 18.08 kg/m   Constitutional: Alert and oriented.  Uncomfortable appearing.  Shifting in seat. HEENT: [place] AT, moist mucus membranes.  Trachea midline, no masses. Cardiovascular: No clubbing, cyanosis, or edema. Respiratory: Normal respiratory effort, no increased work of breathing. GI: Abdomen is soft, nontender, nondistended, no abdominal masses. GU:+ R CVA tenderness, mild Skin: No rashes, bruises or suspicious lesions. Neurologic: Grossly intact, no focal deficits, moving all 4 extremities. Psychiatric: Normal mood and affect.  Laboratory Data: Lab Results  Component Value Date   WBC 5.8 12/02/2017   HGB 15.1 12/02/2017   HCT 43.2 12/02/2017   MCV 94.3 12/02/2017   PLT 262 12/02/2017    Lab Results  Component Value Date   CREATININE 0.61 12/02/2017    Urinalysis Results for orders placed or performed in visit on 12/03/17  Microscopic Examination  Result Value Ref Range   WBC, UA None seen 0 - 5 /hpf   RBC, UA >30 (H) 0 - 2 /hpf   Epithelial Cells (non renal) 0-10 0 - 10 /hpf   Mucus, UA Present (A) Not Estab.   Bacteria, UA Moderate (A) None seen/Few  Urinalysis, Complete  Result Value Ref Range   Specific Gravity, UA 1.020 1.005 - 1.030   pH, UA 7.5 5.0 - 7.5   Color, UA Yellow Yellow    Appearance Ur Cloudy (A) Clear   Leukocytes, UA Negative Negative   Protein, UA Negative Negative/Trace   Glucose, UA Negative Negative   Ketones, UA Negative Negative   RBC, UA 3+ (A) Negative   Bilirubin, UA Negative Negative   Urobilinogen, Ur 0.2 0.2 - 1.0 mg/dL   Nitrite, UA Negative Negative   Microscopic Examination See below:     Pertinent Imaging: Results for orders placed during the hospital encounter of 12/03/17  CT Renal Stone Study   Narrative CLINICAL DATA:  Right-sided abdominal/flank pain  EXAM: CT ABDOMEN AND PELVIS WITHOUT CONTRAST  TECHNIQUE: Multidetector CT imaging of the abdomen and pelvis was performed following the standard protocol without oral or IV contrast.  COMPARISON:  June 08, 2017  FINDINGS: Lower chest: Lung bases are clear.  Hepatobiliary: No focal liver lesions are evident on this noncontrast enhanced study. Gallbladder wall is not appreciably thickened.  There is no biliary duct dilatation.  Pancreas: There is no evident pancreatic mass or inflammatory focus.  Spleen: No splenic lesions are evident.  Adrenals/Urinary Tract: Adrenals bilaterally appear unremarkable. There is no renal mass on either side. There is mild hydronephrosis on the right. There is no hydronephrosis on the left. On the right, there is a 3 mm calculus in the mid kidney with a 1 mm calculus nearby. Slightly more inferiorly, there is a 3 mm calculus with an adjacent 2 mm calculus. There are  two adjacent 2 mm calculi in the lower pole right kidney. On the left, there are multiple 1-3 mm calculi as well as a larger calculus in the upper to mid left kidney measuring 6 x 6 mm.  There is a calculus in the proximal right ureter at the level of L4 measuring 6 x 5 mm. No other ureteral calculi are evident. Urinary bladder is midline with wall thickness within normal limits.  Stomach/Bowel: Rectum is mildly distended with air. There is moderate stool throughout  colon. There are occasional sigmoid diverticula without diverticulitis. There is no evident bowel wall or mesenteric thickening. No evident bowel obstruction. No free air or portal venous air.  Vascular/Lymphatic: There is no abdominal aortic aneurysm. No vascular lesions are evident. There is no appreciable adenopathy by size criteria in the abdomen pelvis. Subcentimeter scattered mesenteric and inguinal lymph nodes are considered nonspecific.  Reproductive: Uterus is antegrade. Intrauterine device is positioned within the endometrium. There is no pelvic mass evident.  Other: Appendix appears unremarkable. No abscess or ascites is evident in the abdomen or pelvis.  Musculoskeletal: There are no blastic or lytic bone lesions. There is no intramuscular or abdominal wall lesion.  IMPRESSION: 1. 6 x 5 mm calculus in the proximal right ureter at the L4 level with fairly mild hydronephrosis on the right.  2. Multiple nonobstructing calculi in each kidney. Most calculi range in size from 1-3 mm, although there is a 6 mm calculus in the upper to mid left kidney.  3. Sigmoid diverticula without diverticulitis. No bowel obstruction. No abscess in the abdomen or pelvis. Appendix region appears normal.  4. Nonspecific subcentimeter mesenteric lymph nodes. In the appropriate clinical setting, a degree of mesenteric adenitis is possible.  5. Intrauterine device within the endometrium. No evident pelvic mass.   Electronically Signed   By: Bretta BangWilliam  Woodruff III M.D.   On: 12/03/2017 08:00    CT scan imaging personally reviewed today and with the patient.  Assessment & Plan:    1. Right ureteral stone 6 mm right proximal ureteral stone with poorly controlled pain She was offered continued medical expulsive therapy versus surgical intervention as soon as tomorrow We discussed various treatment options including ESWL vs. ureteroscopy, laser lithotripsy, and stent. We discussed the  risks and benefits of both including bleeding, infection, damage to surrounding structures, efficacy with need for possible further intervention, and need for temporary ureteral stent. She is most interested in shockwave lithotripsy, however, is concerned about cost She would like to continue medical expulsive therapy for another week, strain her urine regularly, continue Flomax and see if she passes the stone spontaneously She would like to be tentatively booked for shockwave lithotripsy next week if she fails to pass her stone We reviewed warning symptoms carefully today and indications for more emergent/urgent evaluation and intervention She was offered an injection of Toradol today as she is not interested in shockwave lithotripsy tomorrow but declined - Urinalysis, Complete - CULTURE, URINE COMPREHENSIVE  2. Bilateral nephrolithiasis Multiple bilateral nonobstructing renal calculi, measuring up to 6 mm on the left We discussed today that given the frequency of her stone episodes as well as her overall stone burden, she would benefit from stone analysis as well as metabolic evaluation I have asked her to sign a release of records from her previous urologist in [place] as well   Vanna Scotland, MD  [facility] [address] [place], [place] [address] [phone]

## 2017-12-03 NOTE — ED Notes (Signed)
Pt c/o blood in urine earlier this week. Denies it at this time.

## 2017-12-11 ENCOUNTER — Other Ambulatory Visit: Payer: Self-pay

## 2017-12-11 ENCOUNTER — Ambulatory Visit
Admission: RE | Admit: 2017-12-11 | Discharge: 2017-12-11 | Disposition: A | Discharge status: Home / Self Care | Payer: Managed Care, Other (non HMO) | Source: Ambulatory Visit | Attending: Urology | Admitting: Urology

## 2017-12-11 ENCOUNTER — Encounter: Payer: Self-pay | Admitting: *Deleted

## 2017-12-11 ENCOUNTER — Encounter
Admission: RE | Disposition: A | Discharge status: Home / Self Care | Payer: Self-pay | Source: Ambulatory Visit | Attending: Urology

## 2017-12-11 ENCOUNTER — Ambulatory Visit: Payer: Managed Care, Other (non HMO)

## 2017-12-11 DIAGNOSIS — N202 Calculus of kidney with calculus of ureter: Secondary | ICD-10-CM | POA: Insufficient documentation

## 2017-12-11 DIAGNOSIS — N201 Calculus of ureter: Secondary | ICD-10-CM

## 2017-12-11 DIAGNOSIS — N200 Calculus of kidney: Secondary | ICD-10-CM

## 2017-12-11 HISTORY — PX: EXTRACORPOREAL SHOCK WAVE LITHOTRIPSY: SHX1557

## 2017-12-11 LAB — POCT PREGNANCY, URINE: PREG TEST UR: NEGATIVE

## 2017-12-11 SURGERY — LITHOTRIPSY, ESWL
Anesthesia: Moderate Sedation | Laterality: Right

## 2017-12-11 MED ORDER — ONDANSETRON HCL 4 MG/2ML IJ SOLN
4.0000 mg | Freq: Once | INTRAMUSCULAR | Status: AC | PRN
  Administered 2017-12-11: 4 mg via INTRAVENOUS

## 2017-12-11 MED ORDER — DIAZEPAM 5 MG PO TABS
ORAL_TABLET | ORAL | Status: AC
  Filled 2017-12-11: qty 2

## 2017-12-11 MED ORDER — CIPROFLOXACIN HCL 500 MG PO TABS
ORAL_TABLET | ORAL | Status: AC
  Filled 2017-12-11: qty 1

## 2017-12-11 MED ORDER — DIAZEPAM 5 MG PO TABS
10.0000 mg | ORAL_TABLET | ORAL | Status: AC
  Administered 2017-12-11: 10 mg via ORAL

## 2017-12-11 MED ORDER — ONDANSETRON HCL 4 MG/2ML IJ SOLN
INTRAMUSCULAR | Status: AC
Start: 2017-12-11 — End: 2017-12-11
  Administered 2017-12-11: 4 mg via INTRAVENOUS
  Filled 2017-12-11: qty 2

## 2017-12-11 MED ORDER — SODIUM CHLORIDE 0.9 % IV SOLN
INTRAVENOUS | Status: DC
  Administered 2017-12-11: 07:00:00 via INTRAVENOUS

## 2017-12-11 MED ORDER — OXYCODONE-ACETAMINOPHEN 5-325 MG PO TABS: 1.0000 | ORAL_TABLET | ORAL | 0 refills | Status: AC | PRN

## 2017-12-11 MED ORDER — DIPHENHYDRAMINE HCL 25 MG PO CAPS
ORAL_CAPSULE | ORAL | Status: AC
  Filled 2017-12-11: qty 1

## 2017-12-11 MED ORDER — CIPROFLOXACIN HCL 500 MG PO TABS
500.0000 mg | ORAL_TABLET | ORAL | Status: AC
  Administered 2017-12-11: 500 mg via ORAL

## 2017-12-11 MED ORDER — DIPHENHYDRAMINE HCL 25 MG PO CAPS
25.0000 mg | ORAL_CAPSULE | ORAL | Status: AC
  Administered 2017-12-11: 25 mg via ORAL

## 2017-12-11 NOTE — Discharge Instructions (Signed)
Lithotripsy, Care After °This sheet gives you information about how to care for yourself after your procedure. Your health care provider may also give you more specific instructions. If you have problems or questions, contact your health care provider. °What can I expect after the procedure? °After the procedure, it is common to have: °· Some blood in your urine. This should only last for a few days. °· Soreness in your back, sides, or upper abdomen for a few days. °· Blotches or bruises on your back where the pressure wave entered the skin. °· Pain, discomfort, or nausea when pieces (fragments) of the kidney stone move through the tube that carries urine from the kidney to the bladder (ureter). Stone fragments may pass soon after the procedure, but they may continue to pass for up to 4-8 weeks. °? If you have severe pain or nausea, contact your health care provider. This may be caused by a large stone that was not broken up, and this may mean that you need more treatment. °· Some pain or discomfort during urination. °· Some pain or discomfort in the lower abdomen or (in men) at the base of the penis. ° °Follow these instructions at home: °Medicines °· Take over-the-counter and prescription medicines only as told by your health care provider. °· If you were prescribed an antibiotic medicine, take it as told by your health care provider. Do not stop taking the antibiotic even if you start to feel better. °· Do not drive for 24 hours if you were given a medicine to help you relax (sedative). °· Do not drive or use heavy machinery while taking prescription pain medicine. °Eating and drinking °· Drink enough water and fluids to keep your urine clear or pale yellow. This helps any remaining pieces of the stone to pass. It can also help prevent new stones from forming. °· Eat plenty of fresh fruits and vegetables. °· Follow instructions from your health care provider about eating and drinking restrictions. You may be  instructed: °? To reduce how much salt (sodium) you eat or drink. Check ingredients and nutrition facts on packaged foods and beverages. °? To reduce how much meat you eat. °· Eat the recommended amount of calcium for your age and gender. Ask your health care provider how much calcium you should have. °General instructions °· Get plenty of rest. °· Most people can resume normal activities 1-2 days after the procedure. Ask your health care provider what activities are safe for you. °· If directed, strain all urine through the strainer that was provided by your health care provider. °? Keep all fragments for your health care provider to see. Any stones that are found may be sent to a medical lab for examination. The stone may be as small as a grain of salt. °· Keep all follow-up visits as told by your health care provider. This is important. °Contact a health care provider if: °· You have pain that is severe or does not get better with medicine. °· You have nausea that is severe or does not go away. °· You have blood in your urine longer than your health care provider told you to expect. °· You have more blood in your urine. °· You have pain during urination that does not go away. °· You urinate more frequently than usual and this does not go away. °· You develop a rash or any other possible signs of an allergic reaction. °Get help right away if: °· You have severe pain in   your back, sides, or upper abdomen. °· You have severe pain while urinating. °· Your urine is very dark red. °· You have blood in your stool (feces). °· You cannot pass any urine at all. °· You feel a strong urge to urinate after emptying your bladder. °· You have a fever or chills. °· You develop shortness of breath, difficulty breathing, or chest pain. °· You have severe nausea that leads to persistent vomiting. °· You faint. °Summary °· After this procedure, it is common to have some pain, discomfort, or nausea when pieces (fragments) of the  kidney stone move through the tube that carries urine from the kidney to the bladder (ureter). If this pain or nausea is severe, however, you should contact your health care provider. °· Most people can resume normal activities 1-2 days after the procedure. Ask your health care provider what activities are safe for you. °· Drink enough water and fluids to keep your urine clear or pale yellow. This helps any remaining pieces of the stone to pass, and it can help prevent new stones from forming. °· If directed, strain your urine and keep all fragments for your health care provider to see. Fragments or stones may be as small as a grain of salt. °· Get help right away if you have severe pain in your back, sides, or upper abdomen or have severe pain while urinating. °This information is not intended to replace advice given to you by your health care provider. Make sure you discuss any questions you have with your health care provider. °Document Released: 05/19/2007 Document Revised: 03/20/2016 Document Reviewed: 03/20/2016 °Elsevier Interactive Patient Education © 2018 [employer] ° ° °AMBULATORY SURGERY  °DISCHARGE INSTRUCTIONS ° ° °1) The drugs that you were given will stay in your system until tomorrow so for the next 24 hours you should not: ° °A) Drive an automobile °B) Make any legal decisions °C) Drink any alcoholic beverage ° ° °2) You may resume regular meals tomorrow.  Today it is better to start with liquids and gradually work up to solid foods. ° °You may eat anything you prefer, but it is better to start with liquids, then soup and crackers, and gradually work up to solid foods. ° ° °3) Please notify your doctor immediately if you have any unusual bleeding, trouble breathing, redness and pain at the surgery site, drainage, fever, or pain not relieved by medication. ° ° ° °4) Additional Instructions: ° ° ° ° ° ° ° °Please contact your physician with any problems or Same Day Surgery at [phone], Monday  through Friday 6 am to 4 pm, or [facility] Main number at [phone]. °

## 2017-12-24 ENCOUNTER — Ambulatory Visit: Payer: Managed Care, Other (non HMO) | Admitting: Urology

## 2019-01-30 IMAGING — CT CT RENAL STONE PROTOCOL
2 of 4 series · 15 of 46 positions shown, 17 images · non-contrast
Comparison: June 08, 2017

CLINICAL DATA: Right-sided abdominal/flank pain

EXAM:
CT ABDOMEN AND PELVIS WITHOUT CONTRAST
TECHNIQUE: Multidetector CT imaging of the abdomen and pelvis was performed
following the standard protocol without oral or IV contrast.

[Series 2: stone full standard · axial · 0.59mm/px · z∈[-498,-108]mm · 12 of 86 slices shown, 14 images]
[im 4/86  soft-tissue]
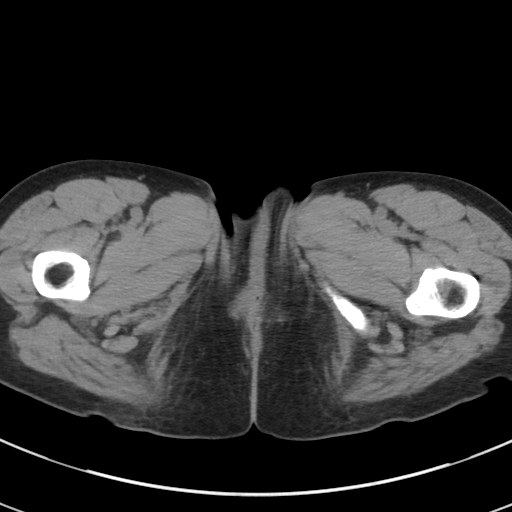
[im 4/86  bone]
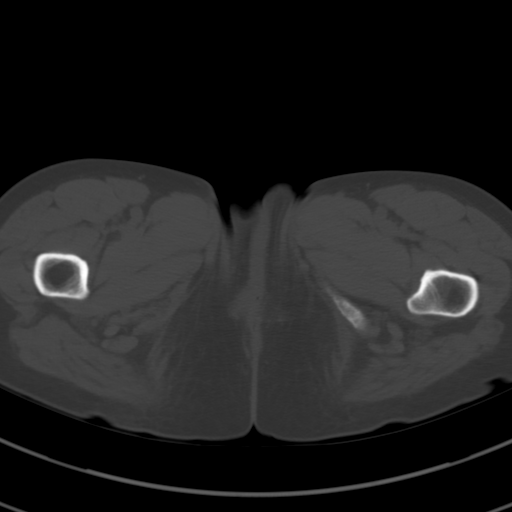
[im 11/86  soft-tissue]
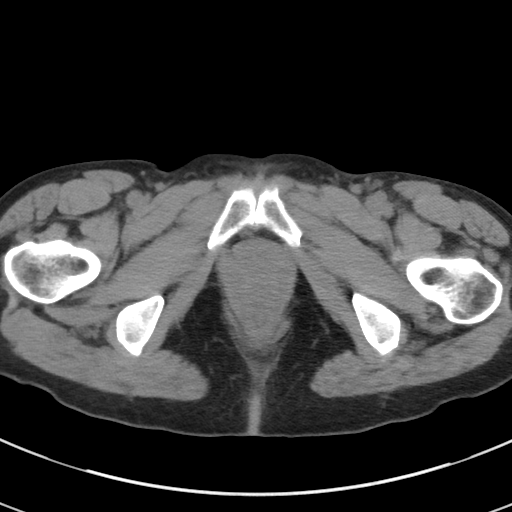
[im 18/86  soft-tissue]
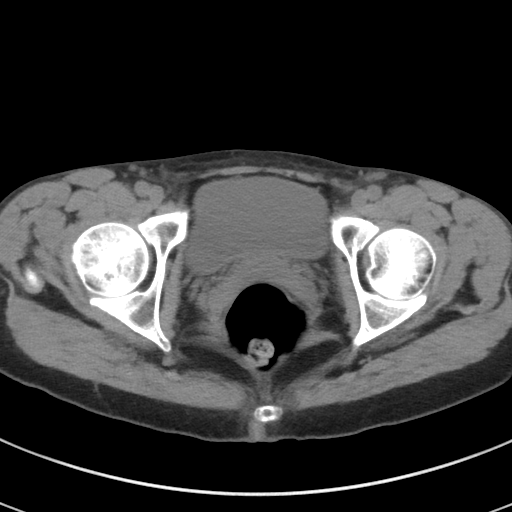
[im 25/86  soft-tissue]
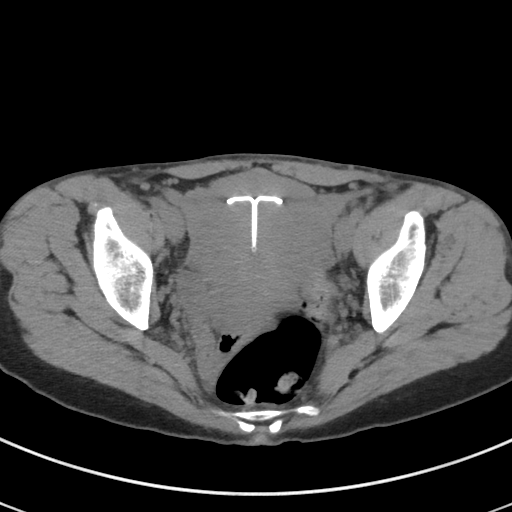
[im 32/86  soft-tissue]
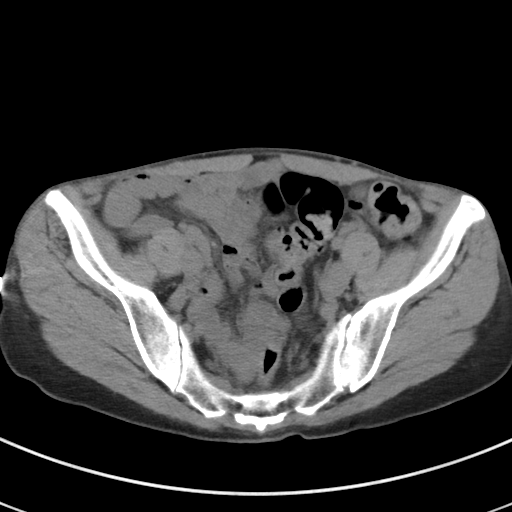
[im 39/86  soft-tissue]
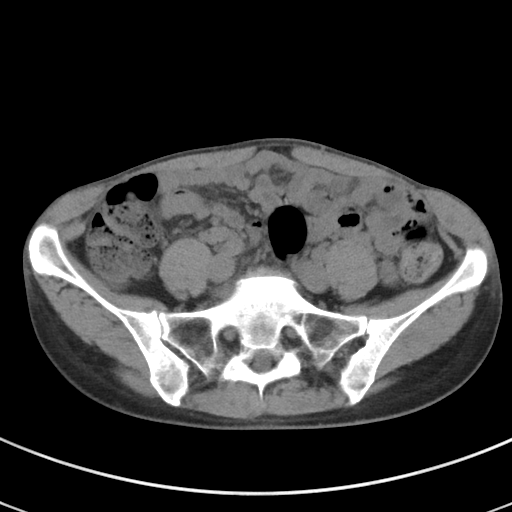
[im 47/86  soft-tissue]
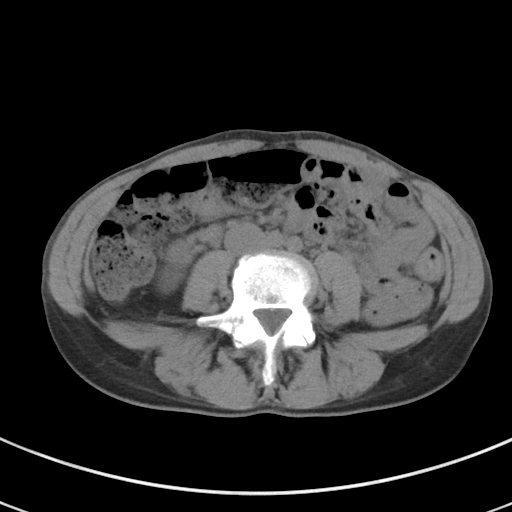
[im 54/86  soft-tissue]
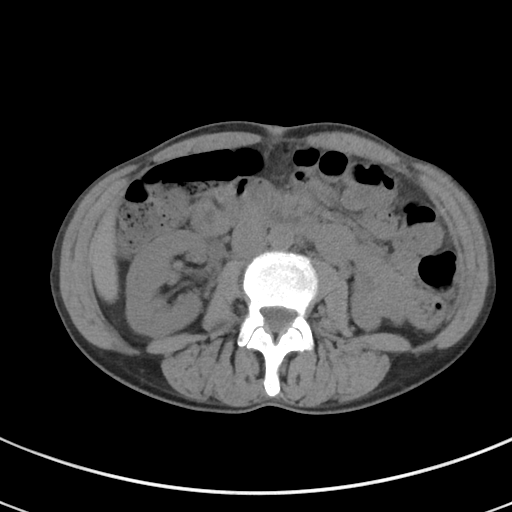
[im 61/86  soft-tissue]
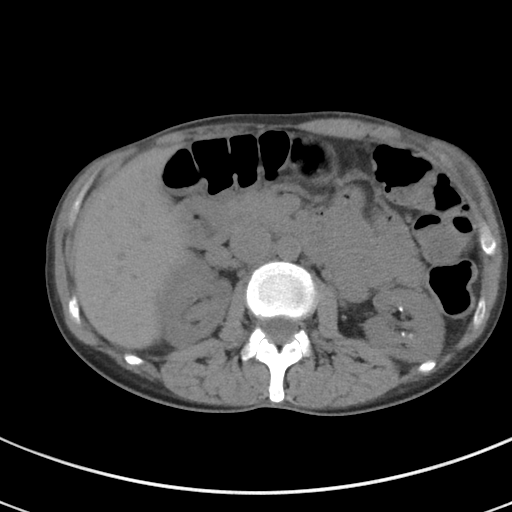
[im 61/86  bone]
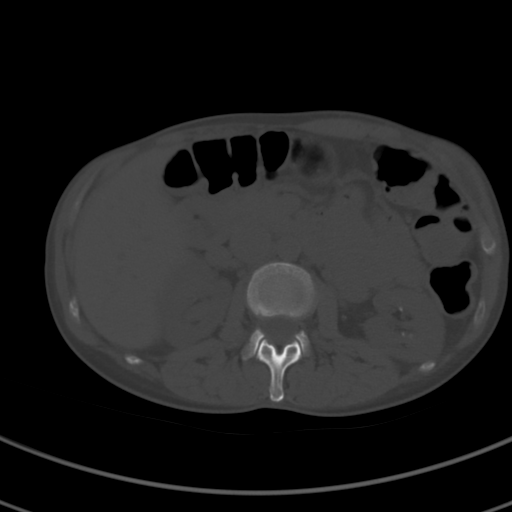
[im 68/86  soft-tissue]
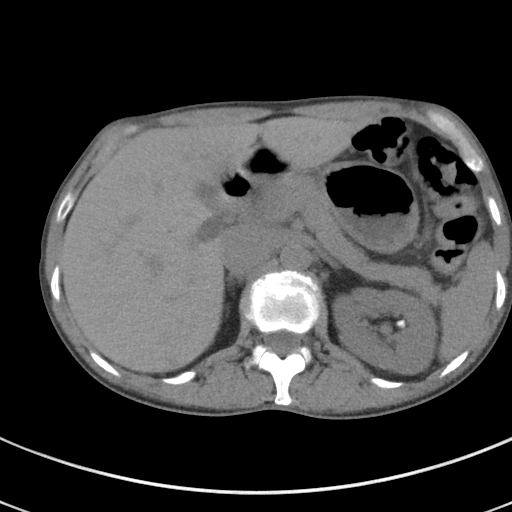
[im 75/86  soft-tissue]
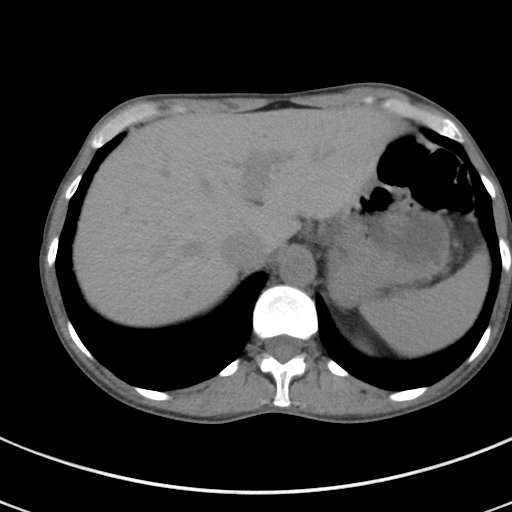
[im 82/86  soft-tissue]
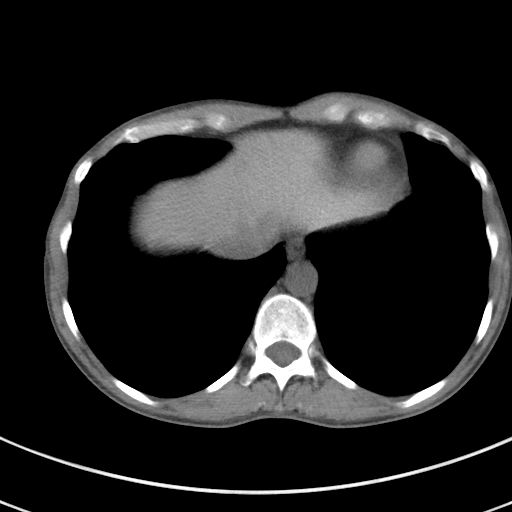

[Series 5: coronal · coronal · 0.70mm/px · 3 of 90 slices shown]
[im 30/90  soft-tissue]
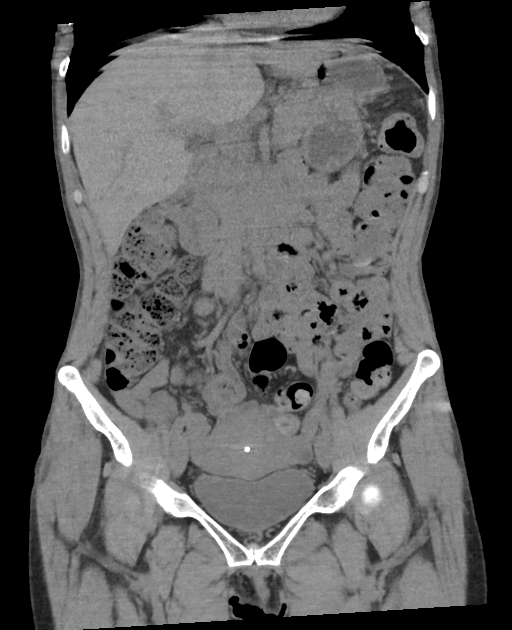
[im 40/90  soft-tissue]
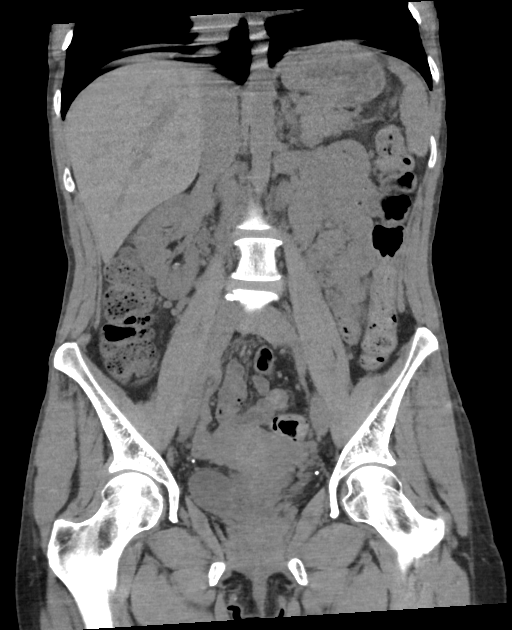
[im 50/90  soft-tissue]
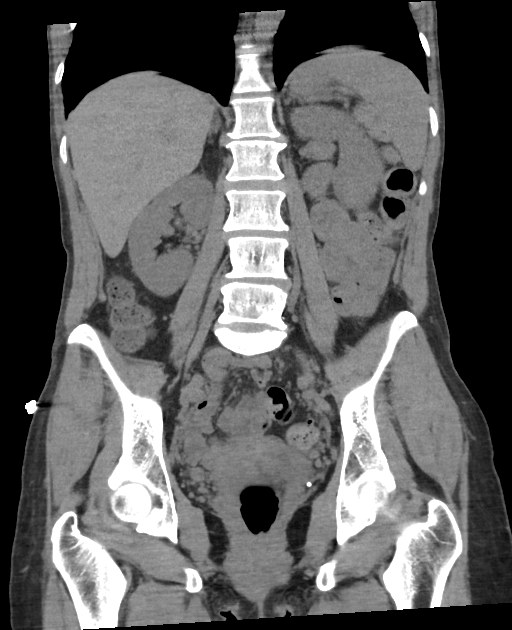

[15 of 46 positions shown; findings below may reference images not displayed]

FINDINGS: Lower chest: Lung bases are clear.

Hepatobiliary: No focal liver lesions are evident on this
noncontrast enhanced study. Gallbladder wall is not appreciably
thickened. There is no biliary duct dilatation.

Pancreas: There is no evident pancreatic mass or inflammatory focus.

Spleen: No splenic lesions are evident.

Adrenals/Urinary Tract: Adrenals bilaterally appear unremarkable.
There is no renal mass on either side. There is mild hydronephrosis
on the right. There is no hydronephrosis on the left. On the right,
there is a 3 mm calculus in the mid kidney with a 1 mm calculus
nearby. Slightly more inferiorly, there is a 3 mm calculus with an
adjacent 2 mm calculus. There are

two adjacent 2 mm calculi in the lower pole right kidney. On the
left, there are multiple 1-3 mm calculi as well as a larger calculus
in the upper to mid left kidney measuring 6 x 6 mm.

There is a calculus in the proximal right ureter at the level of L4
measuring 6 x 5 mm. No other ureteral calculi are evident. Urinary
bladder is midline with wall thickness within normal limits.

Stomach/Bowel: Rectum is mildly distended with air. There is
moderate stool throughout colon. There are occasional sigmoid
diverticula without diverticulitis. There is no evident bowel wall
or mesenteric thickening. No evident bowel obstruction. No free air
or portal venous air.

Vascular/Lymphatic: There is no abdominal aortic aneurysm. No
vascular lesions are evident. There is no appreciable adenopathy by
size criteria in the abdomen pelvis. Subcentimeter scattered
mesenteric and inguinal lymph nodes are considered nonspecific.

Reproductive: Uterus is antegrade. Intrauterine device is positioned
within the endometrium. There is no pelvic mass evident.

Other: Appendix appears unremarkable. No abscess or ascites is
evident in the abdomen or pelvis.

Musculoskeletal: There are no blastic or lytic bone lesions. There
is no intramuscular or abdominal wall lesion.
IMPRESSION: 1. 6 x 5 mm calculus in the proximal right ureter at the L4 level
with fairly mild hydronephrosis on the right.

2. Multiple nonobstructing calculi in each kidney. Most calculi
range in size from 1-3 mm, although there is a 6 mm calculus in the
upper to mid left kidney.

3. Sigmoid diverticula without diverticulitis. No bowel obstruction.
No abscess in the abdomen or pelvis. Appendix region appears normal.

4. Nonspecific subcentimeter mesenteric lymph nodes. In the
appropriate clinical setting, a degree of mesenteric adenitis is
possible.

5. Intrauterine device within the endometrium. No evident pelvic
mass.

## 2019-02-07 IMAGING — CR DG ABDOMEN 1V
1 series · 2 of 2 positions shown · non-contrast
Comparison: CT abdomen pelvis 12/03/2017

CLINICAL DATA: Right ureteral stone

EXAM:
ABDOMEN - 1 VIEW

[Series 1: dg abd 1 view · 0.14mm/px · 2 of 2 slices shown]
[im 1/2]
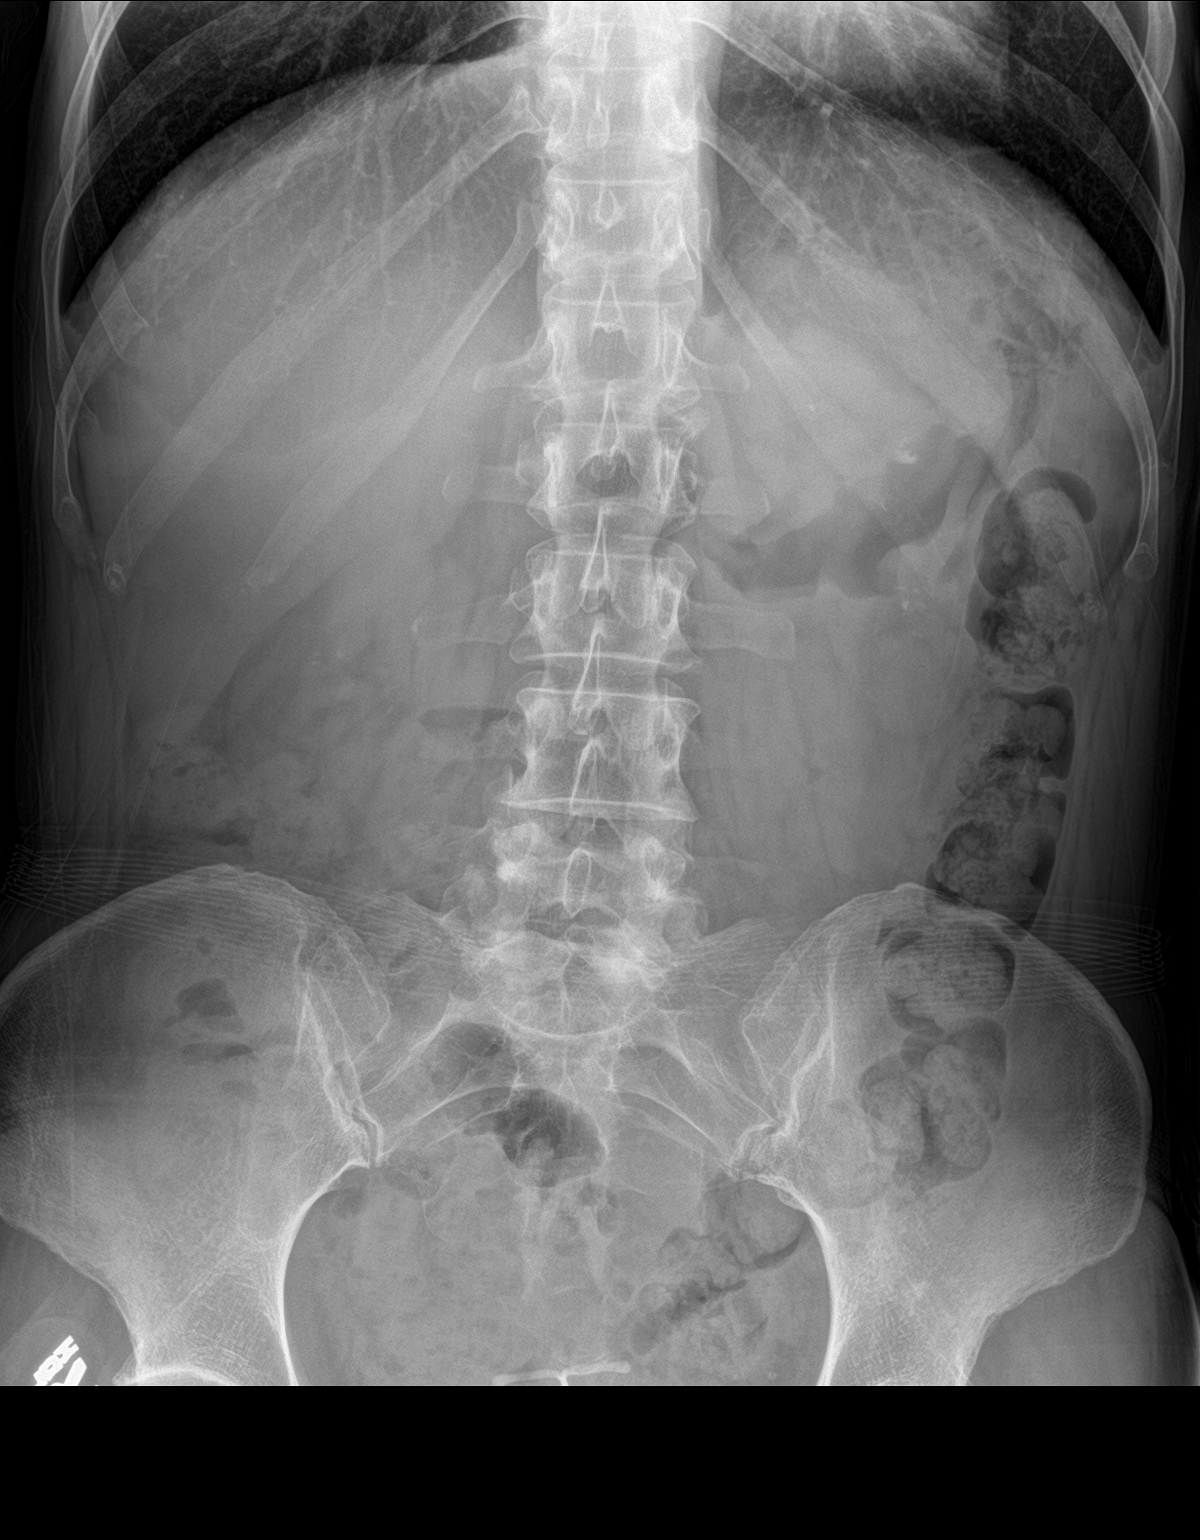
[im 2/2]
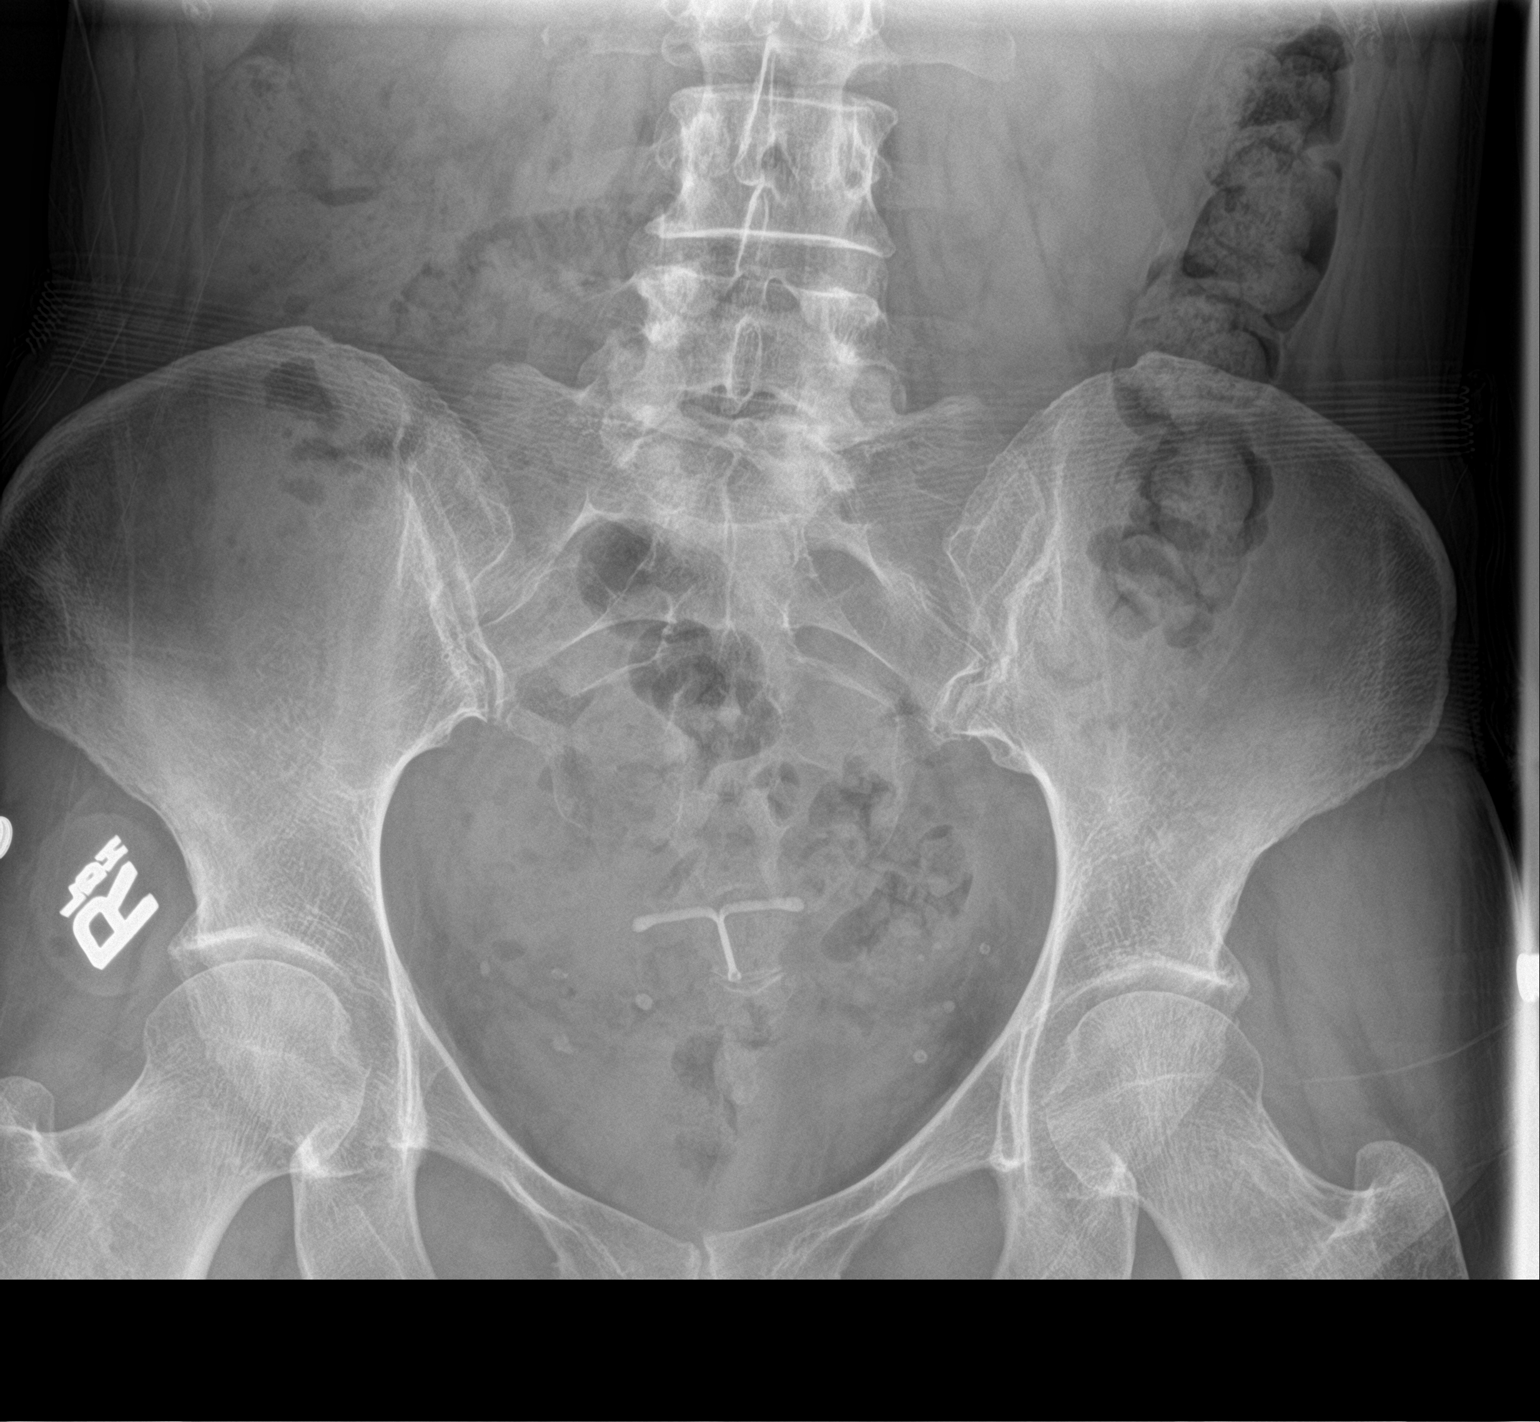

[2 of 2 positions shown; findings below may reference images not displayed]

FINDINGS: Small bilateral renal calculi. Right ureteral calculus on prior CT
at the L4 level appears to have migrated into the distal right
ureter. Three additional phleboliths in the right pelvis.

IUD in the pelvis.  No acute bony abnormality.
IMPRESSION: 5 mm right ureteral calculus appears to migrate to the distal right
ureter. Multiple small bilateral renal calculi.
